# Patient Record
Sex: Female | Born: 1990 | Race: Black or African American | Hispanic: No | Marital: Single | Smoking: Never smoker
Health system: Southern US, Community
[De-identification: ages and names within clinical notes are randomized; demographics above are authoritative.]

## PROBLEM LIST (undated history)

## (undated) DIAGNOSIS — R87629 Unspecified abnormal cytological findings in specimens from vagina: Secondary | ICD-10-CM

## (undated) DIAGNOSIS — F419 Anxiety disorder, unspecified: Secondary | ICD-10-CM

## (undated) HISTORY — PX: LEEP: SHX91

---

## 2019-02-12 ENCOUNTER — Encounter (HOSPITAL_COMMUNITY): Payer: Self-pay | Admitting: *Deleted

## 2019-02-12 ENCOUNTER — Other Ambulatory Visit: Payer: Self-pay

## 2019-02-12 ENCOUNTER — Inpatient Hospital Stay (HOSPITAL_COMMUNITY): Payer: Self-pay

## 2019-02-12 ENCOUNTER — Inpatient Hospital Stay (HOSPITAL_COMMUNITY)
Admission: AD | Admit: 2019-02-12 | Discharge: 2019-02-12 | Disposition: A | Discharge status: Home / Self Care | Payer: Self-pay | Attending: Obstetrics and Gynecology | Admitting: Obstetrics and Gynecology

## 2019-02-12 ENCOUNTER — Other Ambulatory Visit: Payer: Self-pay | Admitting: Nurse Practitioner

## 2019-02-12 DIAGNOSIS — O209 Hemorrhage in early pregnancy, unspecified: Secondary | ICD-10-CM | POA: Insufficient documentation

## 2019-02-12 DIAGNOSIS — Z3A01 Less than 8 weeks gestation of pregnancy: Secondary | ICD-10-CM | POA: Insufficient documentation

## 2019-02-12 DIAGNOSIS — N760 Acute vaginitis: Secondary | ICD-10-CM

## 2019-02-12 DIAGNOSIS — O98811 Other maternal infectious and parasitic diseases complicating pregnancy, first trimester: Secondary | ICD-10-CM

## 2019-02-12 DIAGNOSIS — B9689 Other specified bacterial agents as the cause of diseases classified elsewhere: Secondary | ICD-10-CM | POA: Insufficient documentation

## 2019-02-12 DIAGNOSIS — O23591 Infection of other part of genital tract in pregnancy, first trimester: Secondary | ICD-10-CM | POA: Insufficient documentation

## 2019-02-12 DIAGNOSIS — O219 Vomiting of pregnancy, unspecified: Secondary | ICD-10-CM

## 2019-02-12 HISTORY — DX: Unspecified abnormal cytological findings in specimens from vagina: R87.629

## 2019-02-12 HISTORY — DX: Anxiety disorder, unspecified: F41.9

## 2019-02-12 LAB — URINALYSIS, ROUTINE W REFLEX MICROSCOPIC
Bilirubin Urine: NEGATIVE
Glucose, UA: NEGATIVE mg/dL
Ketones, ur: 5 mg/dL — AB
Nitrite: NEGATIVE
Protein, ur: NEGATIVE mg/dL
Specific Gravity, Urine: 1.014 (ref 1.005–1.030)
pH: 6 (ref 5.0–8.0)

## 2019-02-12 LAB — ABO/RH: ABO/RH(D): A POS

## 2019-02-12 LAB — WET PREP, GENITAL
Sperm: NONE SEEN
Trich, Wet Prep: NONE SEEN
Yeast Wet Prep HPF POC: NONE SEEN

## 2019-02-12 LAB — CBC
HCT: 41.3 % (ref 36.0–46.0)
Hemoglobin: 14.5 g/dL (ref 12.0–15.0)
MCH: 33 pg (ref 26.0–34.0)
MCHC: 35.1 g/dL (ref 30.0–36.0)
MCV: 94.1 fL (ref 80.0–100.0)
Platelets: 263 10*3/uL (ref 150–400)
RBC: 4.39 MIL/uL (ref 3.87–5.11)
RDW: 12.4 % (ref 11.5–15.5)
WBC: 7.9 10*3/uL (ref 4.0–10.5)
nRBC: 0 % (ref 0.0–0.2)

## 2019-02-12 LAB — POCT PREGNANCY, URINE: Preg Test, Ur: POSITIVE — AB

## 2019-02-12 LAB — HCG, QUANTITATIVE, PREGNANCY: hCG, Beta Chain, Quant, S: 77326 m[IU]/mL — ABNORMAL HIGH (ref <5)

## 2019-02-12 MED ORDER — PROMETHAZINE HCL 25 MG PO TABS: 12.5000 mg | ORAL_TABLET | Freq: Four times a day (QID) | ORAL | 1 refills | Status: DC | PRN

## 2019-02-12 MED ORDER — METRONIDAZOLE 500 MG PO TABS: 500.0000 mg | ORAL_TABLET | Freq: Two times a day (BID) | ORAL | 0 refills | Status: DC

## 2019-02-12 NOTE — MAU Note (Signed)
Cramping and bleeding real bad.  Just started bleeding today, had a gush of blood after she threw up.  Has been throwing up every day. Pt is preg, has not been seen in office, just found 2 wks ago.

## 2019-02-12 NOTE — MAU Provider Note (Signed)
History     CSN: [id number]  Arrival date and time: 02/12/19 1211   First Provider Initiated Contact with Patient 02/12/19 1440      Chief Complaint  Patient presents with  . Vaginal Bleeding  . Abdominal Pain  . Emesis  . Possible Pregnancy   HPI Stephanie Galloway 28 y.o. [redacted]w[redacted]d Comes to MAU with spontaneous vaginal bleeding today.  Has not started prenatal care.  Recently moved to [place].  Had sudden onset of vaginal bleeding with vomiting.  Bled through her clothes.  OB History    Gravida  4   Para  2   Term  2   Preterm  0   AB  1   Living  2     SAB  1   TAB      Ectopic      Multiple      Live Births  2        Obstetric Comments  Oligo w/first. 2nd was repeat c/s        Past Medical History:  Diagnosis Date  . Anxiety    not currently on meds  . Vaginal Pap smear, abnormal     Past Surgical History:  Procedure Laterality Date  . CESAREAN SECTION    . LEEP      Family History  Problem Relation Age of Onset  . Hypertension Mother   . Hypertension Maternal Grandmother     Social History   Tobacco Use  . Smoking status: Never Smoker  . Smokeless tobacco: Never Used  Substance Use Topics  . Alcohol use: Never    Frequency: Never  . Drug use: Never    Allergies: No Known Allergies  No medications prior to admission.    Review of Systems  Constitutional: Negative for fever.  Respiratory: Negative for cough and shortness of breath.   Gastrointestinal: Positive for nausea and vomiting. Negative for abdominal pain.  Genitourinary: Positive for vaginal bleeding. Negative for dysuria.   Physical Exam   Blood pressure (!) 98/59, pulse 67, temperature 98.3 F (36.8 C), temperature source Oral, resp. rate 16, height 5\' 4"  (1.626 m), weight 51.9 kg, last menstrual period 01/03/2019, SpO2 100 %.  Physical Exam  Nursing note and vitals reviewed. Constitutional: She is oriented to person, place, and time. She appears  well-developed and well-nourished.  HENT:  Head: Normocephalic.  Eyes: EOM are normal.  Neck: Neck supple.  GI: Soft. There is no abdominal tenderness. There is no rebound and no guarding.  Genitourinary:    Genitourinary Comments: Speculum exam: Vagina - Small amount of pink discharge, no odor Cervix - No contact bleeding, no active bleeding, closed Bimanual exam:  Very tense with bimanual Cervix closed Uterus non tender, Unable to size Adnexa non tender, no masses bilaterally GC/Chlam, wet prep done Chaperone present for exam.    Musculoskeletal: Normal range of motion.  Neurological: She is alert and oriented to person, place, and time.  Skin: Skin is warm and dry.  Psychiatric: She has a normal mood and affect.    MAU Course  Procedures  Labs Results for orders placed or performed during the hospital encounter of 02/12/19 (from the past 24 hour(s))  Pregnancy, urine POC     Status: Abnormal   Collection Time: 02/12/19  1:06 PM  Result Value Ref Range   Preg Test, Ur POSITIVE (A) NEGATIVE  Urinalysis, Routine w reflex microscopic     Status: Abnormal   Collection Time: 02/12/19  1:12 PM  Result Value Ref Range   Color, Urine YELLOW YELLOW   APPearance HAZY (A) CLEAR   Specific Gravity, Urine 1.014 1.005 - 1.030   pH 6.0 5.0 - 8.0   Glucose, UA NEGATIVE NEGATIVE mg/dL   Hgb urine dipstick MODERATE (A) NEGATIVE   Bilirubin Urine NEGATIVE NEGATIVE   Ketones, ur 5 (A) NEGATIVE mg/dL   Protein, ur NEGATIVE NEGATIVE mg/dL   Nitrite NEGATIVE NEGATIVE   Leukocytes,Ua TRACE (A) NEGATIVE   RBC / HPF 0-5 0 - 5 RBC/hpf   WBC, UA 0-5 0 - 5 WBC/hpf   Bacteria, UA MANY (A) NONE SEEN   Squamous Epithelial / LPF 0-5 0 - 5   Mucus PRESENT   CBC     Status: None   Collection Time: 02/12/19  2:25 PM  Result Value Ref Range   WBC 7.9 4.0 - 10.5 K/uL   RBC 4.39 3.87 - 5.11 MIL/uL   Hemoglobin 14.5 12.0 - 15.0 g/dL   HCT 86.541.3 78.436.0 - 69.646.0 %   MCV 94.1 80.0 - 100.0 fL   MCH 33.0  26.0 - 34.0 pg   MCHC 35.1 30.0 - 36.0 g/dL   RDW 29.512.4 28.411.5 - 13.215.5 %   Platelets 263 150 - 400 K/uL   nRBC 0.0 0.0 - 0.2 %  hCG, quantitative, pregnancy     Status: Abnormal   Collection Time: 02/12/19  2:25 PM  Result Value Ref Range   hCG, Beta Chain, Quant, S 77,326 (H) <5 mIU/mL  ABO/Rh     Status: None   Collection Time: 02/12/19  2:25 PM  Result Value Ref Range   ABO/RH(D) A POS    No rh immune globuloin      NOT A RH IMMUNE GLOBULIN CANDIDATE, PT RH POSITIVE Performed at [facility], [address] [place], [place] [address]   Wet prep, genital     Status: Abnormal   Collection Time: 02/12/19  2:51 PM  Result Value Ref Range   Yeast Wet Prep HPF POC NONE SEEN NONE SEEN   Trich, Wet Prep NONE SEEN NONE SEEN   Clue Cells Wet Prep HPF POC PRESENT (A) NONE SEEN   WBC, Wet Prep HPF POC MANY (A) NONE SEEN   Sperm NONE SEEN    CLINICAL DATA:  Vaginal bleeding and pelvic cramping for 1 day. Gestational age by LMP of 5 weeks 5 days.  EXAM: OBSTETRIC <14 WK US AND TRANSVAGINAL OB US  TECHNIQUE: Both transabdominal and transvaginal ultrasound examinations were performed for complete evaluation of the gestation as well as the maternal uterus, adnexal regions, and pelvic cul-de-sac. Transvaginal technique was performed to assess early pregnancy.  COMPARISON:  None.  FINDINGS: Intrauterine gestational sac: Single  Yolk sac:  Visualized.  Embryo:  Visualized.  Cardiac Activity: Visualized.  Heart Rate: 103 bpm  CRL:  3 mm   5 w   6 d                  US EDC: 10/09/2019  Subchorionic hemorrhage:  None visualized.  Maternal uterus/adnexae: Retroverted uterus. Both ovaries are normal in appearance. No mass or abnormal free fluid identified.  IMPRESSION: Single living IUP measuring 5 weeks 6 days, with US EDC of 10/09/2019.  No maternal uterine or adnexal abnormality identified.   MDM Could be a subchorionic hemorrhage that resolved with  the sudden episode of vaginal bleeding today.  Will need to begin prenatal care.  Advised pelvic rest for at least one week  due to bleeding today.  Assessment and Plan  Vaginal bleeding in pregnancy, currently resolved Bacterial vaginosis  Plan No smoking, no drugs, no alcohol.   Take a prenatal vitamin one by mouth every day.   Eat small frequent snacks to avoid nausea.   Begin prenatal care as soon as possible. Pelvic rest for one week or longer if having any vaginal bleeding.   Terri L Burleson 02/12/2019, 4:53 PM

## 2019-02-12 NOTE — Discharge Instructions (Signed)
No smoking, no drugs, no alcohol.   Take a prenatal vitamin one by mouth every day.   Eat small frequent snacks to avoid nausea.   Begin prenatal care as soon as possible.    Safe Medications in Pregnancy   Acne: Benzoyl Peroxide Salicylic Acid  Backache/Headache: Tylenol: 2 regular strength every 4 hours OR              2 Extra strength every 6 hours  Colds/Coughs/Allergies: Benadryl (alcohol free) 25 mg every 6 hours as needed Breath right strips Claritin Cepacol throat lozenges Chloraseptic throat spray Cold-Eeze- up to three times per day Cough drops, alcohol free Flonase  Guaifenesin Mucinex Robitussin DM (plain only, alcohol free) Saline nasal spray/drops Sudafed (pseudoephedrine) & Actifed ** use only after [redacted] weeks gestation and if you do not have high blood pressure Tylenol Vicks Vaporub Zinc lozenges Zyrtec   Constipation: Colace Ducolax suppositories Fleet enema Glycerin suppositories Metamucil Milk of magnesia Miralax Senokot Smooth move tea  Diarrhea: Kaopectate Imodium A-D  *NO pepto Bismol  Hemorrhoids: Anusol Anusol HC Preparation H Tucks  Indigestion: Tums Maalox Mylanta Zantac  Pepcid  Insomnia: Benadryl (alcohol free) 25mg  every 6 hours as needed Tylenol PM Unisom, no Gelcaps  Leg Cramps: Tums MagGel  Nausea/Vomiting:  Bonine Dramamine Emetrol Ginger extract Sea bands Meclizine  Nausea medication to take during pregnancy:  Unisom (doxylamine succinate 25 mg tablets) Take one tablet daily at bedtime. If symptoms are not adequately controlled, the dose can be increased to a maximum recommended dose of two tablets daily (1/2 tablet in the morning, 1/2 tablet mid-afternoon and one at bedtime). Vitamin B6 100mg  tablets. Take one tablet twice a day (up to 200 mg per day).  Skin Rashes: Aveeno products Benadryl cream or 25mg  every 6 hours as needed Calamine Lotion 1% cortisone cream  Yeast  infection: Gyne-lotrimin 7 Monistat 7   **If taking multiple medications, please check labels to avoid duplicating the same active ingredients **take medication as directed on the label ** Do not exceed 4000 mg of tylenol in 24 hours **Do not take medications that contain aspirin or ibuprofen    Prenatal [place] for [place] @ [facility]   Phone: [place] for [place] @ [facility]   Phone: [place] @[facility]       Phone: [place] for [place] @ [facility]     Phone: [place] for [place] @ [facility]   Phone: [place] for [place] @ [facility]  Phone: [place] for [place] @ [facility] Phone: [phone]     [place] [facility]  Phone: [facility]  Phone: [facility] Phone: [phone]  [facility] Phone: [phone]  [facility] Phone: [phone]  [facility] Phone: [phone]  [facility]  Phone: [phone]

## 2019-02-13 LAB — CERVICOVAGINAL ANCILLARY ONLY
Chlamydia: NEGATIVE
Neisseria Gonorrhea: NEGATIVE

## 2019-02-13 LAB — RPR: RPR Ser Ql: NONREACTIVE

## 2019-02-13 LAB — HIV ANTIBODY (ROUTINE TESTING W REFLEX): HIV Screen 4th Generation wRfx: NONREACTIVE

## 2019-06-26 ENCOUNTER — Encounter (HOSPITAL_COMMUNITY): Payer: Self-pay | Admitting: Emergency Medicine

## 2019-06-26 ENCOUNTER — Other Ambulatory Visit: Payer: Self-pay

## 2019-06-26 ENCOUNTER — Emergency Department (HOSPITAL_COMMUNITY)
Admission: EM | Admit: 2019-06-26 | Discharge: 2019-06-26 | Disposition: A | Discharge status: Home / Self Care | Payer: Medicaid Other | Attending: Emergency Medicine | Admitting: Emergency Medicine

## 2019-06-26 DIAGNOSIS — Y999 Unspecified external cause status: Secondary | ICD-10-CM | POA: Insufficient documentation

## 2019-06-26 DIAGNOSIS — Y9389 Activity, other specified: Secondary | ICD-10-CM | POA: Insufficient documentation

## 2019-06-26 DIAGNOSIS — Z3202 Encounter for pregnancy test, result negative: Secondary | ICD-10-CM | POA: Insufficient documentation

## 2019-06-26 DIAGNOSIS — Y92481 Parking lot as the place of occurrence of the external cause: Secondary | ICD-10-CM | POA: Insufficient documentation

## 2019-06-26 DIAGNOSIS — R0789 Other chest pain: Secondary | ICD-10-CM | POA: Insufficient documentation

## 2019-06-26 DIAGNOSIS — M542 Cervicalgia: Secondary | ICD-10-CM | POA: Insufficient documentation

## 2019-06-26 LAB — POC URINE PREG, ED: Preg Test, Ur: NEGATIVE

## 2019-06-26 MED ORDER — ACETAMINOPHEN 325 MG PO TABS
650.0000 mg | ORAL_TABLET | Freq: Once | ORAL | Status: AC
  Administered 2019-06-26: 14:00:00 650 mg via ORAL
  Filled 2019-06-26: qty 2

## 2019-06-26 MED ORDER — CYCLOBENZAPRINE HCL 10 MG PO TABS: 10.0000 mg | ORAL_TABLET | Freq: Two times a day (BID) | ORAL | 0 refills | Status: DC | PRN
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
## 2019-06-26 NOTE — ED Notes (Signed)
Pt verbalizes understanding of DC instructions. Pt belongings returned and is ambulatory out of ED.  
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
## 2019-06-26 NOTE — ED Notes (Signed)
Pt ambulatory to RR independently for urine sample 

## 2020-12-16 ENCOUNTER — Ambulatory Visit (INDEPENDENT_AMBULATORY_CARE_PROVIDER_SITE_OTHER): Payer: Medicaid Other

## 2020-12-16 ENCOUNTER — Other Ambulatory Visit: Payer: Self-pay

## 2020-12-16 ENCOUNTER — Ambulatory Visit (HOSPITAL_COMMUNITY)
Admission: EM | Admit: 2020-12-16 | Discharge: 2020-12-16 | Disposition: A | Discharge status: Home / Self Care | Payer: Medicaid Other | Attending: Internal Medicine | Admitting: Internal Medicine

## 2020-12-16 ENCOUNTER — Encounter (HOSPITAL_COMMUNITY): Payer: Self-pay | Admitting: *Deleted

## 2020-12-16 DIAGNOSIS — W19XXXA Unspecified fall, initial encounter: Secondary | ICD-10-CM

## 2020-12-16 DIAGNOSIS — S93602A Unspecified sprain of left foot, initial encounter: Secondary | ICD-10-CM | POA: Diagnosis not present

## 2020-12-16 DIAGNOSIS — M79672 Pain in left foot: Secondary | ICD-10-CM | POA: Diagnosis not present

## 2020-12-16 MED ORDER — PREDNISONE 20 MG PO TABS: 20.0000 mg | ORAL_TABLET | Freq: Every day | ORAL | 0 refills | Status: AC

## 2020-12-16 NOTE — ED Triage Notes (Addendum)
Pt reports tripping over her little dog approx 1 month ago; states has had continued pain to left lateral foot since incident; states pain became worse over past 2 wks.  Pt feels there is a "knot" to area.  LLE CMS intact.  Pt ambulates with limp.

## 2020-12-16 NOTE — ED Provider Notes (Signed)
[facility]    CSN: [id number] Arrival date & time: 12/16/20  0802      History   Chief Complaint Chief Complaint  Patient presents with   Foot Injury    HPI Stephanie Galloway is a 30 y.o. female was to the urgent care with complaints of left foot pain of about 2 weeks duration.  Patient sustained an injury when she tripped over her dog about a month ago.  Patient initially had some left foot pain but did not seek medical care at that time.  The foot pain improved until about a couple weeks ago when she started experiencing recurrence of the pain.  Pain is currently 6 out of 10, throbbing, aggravated by bearing weight and no known relieving factors.  Patient has some numbness in the small toe.  She gets occasional swelling of the left foot.  No known relieving factors.  No bruising on the left foot.  No fever or chills.  No redness of the left foot.   HPI  Past Medical History:  Diagnosis Date   Anxiety    not currently on meds   Vaginal Pap smear, abnormal     There are no problems to display for this patient.   Past Surgical History:  Procedure Laterality Date   CESAREAN SECTION     LEEP      OB History     Gravida  4   Para  2   Term  2   Preterm  0   AB  1   Living  2      SAB  1   IAB      Ectopic      Multiple      Live Births  2        Obstetric Comments  Oligo w/first. 2nd was repeat c/s          Home Medications    Prior to Admission medications   Medication Sig Start Date End Date Taking? Authorizing Provider  predniSONE (DELTASONE) 20 MG tablet Take 1 tablet (20 mg total) by mouth daily for 5 days. 12/16/20 12/21/20 Yes Phallon Haydu, Britta Mccreedy, MD    Family History Family History  Problem Relation Age of Onset   Hypertension Mother    Hypertension Maternal Grandmother     Social History Social History   Tobacco Use   Smoking status: Never   Smokeless tobacco: Never  Vaping Use   Vaping Use: Never used  Substance  Use Topics   Alcohol use: Not Currently   Drug use: Never     Allergies   Patient has no known allergies.   Review of Systems Review of Systems  Constitutional: Negative.   Musculoskeletal:  Positive for arthralgias. Negative for back pain, joint swelling and myalgias.  Skin: Negative.     Physical Exam Triage Vital Signs ED Triage Vitals  Enc Vitals Group     BP 12/16/20 0834 112/82     Pulse Rate 12/16/20 0834 78     Resp 12/16/20 0834 16     Temp 12/16/20 0834 98.5 F (36.9 C)     Temp Source 12/16/20 0834 Oral     SpO2 12/16/20 0834 98 %     Weight --      Height --      Head Circumference --      Peak Flow --      Pain Score 12/16/20 0836 6     Pain Loc --  Pain Edu? --      Excl. in GC? --    No data found.  Updated Vital Signs BP 112/82   Pulse 78   Temp 98.5 F (36.9 C) (Oral)   Resp 16   LMP 11/18/2020 (Exact Date)   SpO2 98%   Visual Acuity Right Eye Distance:   Left Eye Distance:   Bilateral Distance:    Right Eye Near:   Left Eye Near:    Bilateral Near:     Physical Exam Vitals and nursing note reviewed.  Constitutional:      General: She is not in acute distress.    Appearance: She is not ill-appearing.  Cardiovascular:     Rate and Rhythm: Normal rate and regular rhythm.  Musculoskeletal:        General: Tenderness present. Normal range of motion.     Comments: No swelling or deformity of the left foot.  Neurological:     Mental Status: She is alert.     UC Treatments / Results  Labs (all labs ordered are listed, but only abnormal results are displayed) Labs Reviewed - No data to display  EKG   Radiology DG Foot Complete Left  Result Date: 12/16/2020 CLINICAL DATA:  Lateral foot pain since a fall 1 month ago. EXAM: LEFT FOOT - COMPLETE 3+ VIEW COMPARISON:  None. FINDINGS: There is no evidence of fracture or dislocation. There is no evidence of arthropathy or other focal bone abnormality. Soft tissues are  unremarkable. IMPRESSION: Negative. Electronically Signed   By: Kennith Center M.D.   On: 12/16/2020 09:15    Procedures Procedures (including critical care time)  Medications Ordered in UC Medications - No data to display  Initial Impression / Assessment and Plan / UC Course  I have reviewed the triage vital signs and the nursing notes.  Pertinent labs & imaging results that were available during my care of the patient were reviewed by me and considered in my medical decision making (see chart for details).    1.  Left foot sprain: X-ray of the left foot is negative for acute fracture Elevation of the left leg with icing of the right leg advised NSAIDs Padding of her shoes will be helpful. Return to urgent care if symptoms worsen. Final Clinical Impressions(s) / UC Diagnoses   Final diagnoses:  Sprain of left foot, initial encounter     Discharge Instructions      Icing of the left foot Gentle range of motion exercises Take medications as prescribed Get some padding to put in your foot wear If symptoms worsen, will recommend referral to a podiatrist.   ED Prescriptions     Medication Sig Dispense Auth. Provider   predniSONE (DELTASONE) 20 MG tablet Take 1 tablet (20 mg total) by mouth daily for 5 days. 5 tablet Eliot Popper, Britta Mccreedy, MD      PDMP not reviewed this encounter.   Merrilee Jansky, MD 12/16/20 [phone]

## 2020-12-16 NOTE — Discharge Instructions (Addendum)
Icing of the left foot Gentle range of motion exercises Take medications as prescribed Get some padding to put in your foot wear If symptoms worsen, will recommend referral to a podiatrist.

## 2021-11-16 ENCOUNTER — Emergency Department (HOSPITAL_COMMUNITY)
Admission: EM | Admit: 2021-11-16 | Discharge: 2021-11-16 | Disposition: A | Discharge status: Home / Self Care | Payer: No Typology Code available for payment source | Attending: Emergency Medicine | Admitting: Emergency Medicine

## 2021-11-16 ENCOUNTER — Emergency Department (HOSPITAL_COMMUNITY): Payer: No Typology Code available for payment source

## 2021-11-16 ENCOUNTER — Other Ambulatory Visit: Payer: Self-pay

## 2021-11-16 ENCOUNTER — Encounter (HOSPITAL_COMMUNITY): Payer: Self-pay

## 2021-11-16 DIAGNOSIS — R55 Syncope and collapse: Secondary | ICD-10-CM | POA: Diagnosis not present

## 2021-11-16 DIAGNOSIS — Y92410 Unspecified street and highway as the place of occurrence of the external cause: Secondary | ICD-10-CM | POA: Diagnosis not present

## 2021-11-16 DIAGNOSIS — M25522 Pain in left elbow: Secondary | ICD-10-CM | POA: Diagnosis not present

## 2021-11-16 DIAGNOSIS — R0781 Pleurodynia: Secondary | ICD-10-CM | POA: Diagnosis not present

## 2021-11-16 DIAGNOSIS — R519 Headache, unspecified: Secondary | ICD-10-CM | POA: Insufficient documentation

## 2021-11-16 DIAGNOSIS — M542 Cervicalgia: Secondary | ICD-10-CM | POA: Insufficient documentation

## 2021-11-16 MED ORDER — CYCLOBENZAPRINE HCL 10 MG PO TABS: 10.0000 mg | ORAL_TABLET | Freq: Two times a day (BID) | ORAL | 0 refills | Status: AC | PRN

## 2021-11-16 MED ORDER — IBUPROFEN 600 MG PO TABS: 600.0000 mg | ORAL_TABLET | Freq: Four times a day (QID) | ORAL | 0 refills | Status: AC | PRN

## 2021-11-16 MED ORDER — OXYCODONE-ACETAMINOPHEN 5-325 MG PO TABS
1.0000 | ORAL_TABLET | Freq: Once | ORAL | Status: AC
  Administered 2021-11-16: 1 via ORAL
  Filled 2021-11-16: qty 1
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
